# Patient Record
Sex: Female | Born: 1975 | Race: Black or African American | Hispanic: No
Health system: Southern US, Community
[De-identification: ages and names within clinical notes are randomized; demographics above are authoritative.]

## PROBLEM LIST (undated history)

## (undated) HISTORY — PX: ABDOMINAL HYSTERECTOMY: SHX81

## (undated) HISTORY — PX: BREAST LUMPECTOMY: SHX2

---

## 2017-05-08 ENCOUNTER — Encounter (HOSPITAL_COMMUNITY): Payer: Self-pay | Admitting: Emergency Medicine

## 2017-05-08 ENCOUNTER — Ambulatory Visit (HOSPITAL_COMMUNITY)
Admission: EM | Admit: 2017-05-08 | Discharge: 2017-05-08 | Disposition: A | Discharge status: Home / Self Care | Payer: Self-pay | Attending: Family Medicine | Admitting: Family Medicine

## 2017-05-08 ENCOUNTER — Ambulatory Visit (INDEPENDENT_AMBULATORY_CARE_PROVIDER_SITE_OTHER): Payer: Self-pay

## 2017-05-08 DIAGNOSIS — Y9367 Activity, basketball: Secondary | ICD-10-CM

## 2017-05-08 DIAGNOSIS — S62522A Displaced fracture of distal phalanx of left thumb, initial encounter for closed fracture: Secondary | ICD-10-CM

## 2017-05-08 MED ORDER — HYDROCODONE-ACETAMINOPHEN 5-325 MG PO TABS: 1.0000 | ORAL_TABLET | Freq: Four times a day (QID) | ORAL | 0 refills | Status: AC | PRN

## 2017-05-08 MED ORDER — MELOXICAM 15 MG PO TABS: 15.0000 mg | ORAL_TABLET | Freq: Every day | ORAL | 0 refills | Status: AC

## 2017-05-08 NOTE — ED Triage Notes (Signed)
PT injured left thumb playing basketball Monday night. She cannot bend it now and area is swollen.

## 2017-05-08 NOTE — ED Provider Notes (Signed)
[facility]    CSN: [id number] Arrival date & time: 05/08/17  1537     History   Chief Complaint Chief Complaint  Patient presents with  . Finger Injury    HPI Christina Atkins is a 42 y.o. female.   42 year old female comes in for 4-day history of left thumb pain after injury playing basketball.  States that she  hit the basketball, and that is when the pain started.  States area is swollen and cannot bend.  Been taking ibuprofen with some relief, but worsening symptoms today.  Denies numbness, tingling.      History reviewed. No pertinent past medical history.  There are no active problems to display for this patient.   Past Surgical History:  Procedure Laterality Date  . ABDOMINAL HYSTERECTOMY     partial  . BREAST LUMPECTOMY      OB History    No data available       Home Medications    Prior to Admission medications   Medication Sig Start Date End Date Taking? Authorizing Provider  simvastatin (ZOCOR) 10 MG tablet Take 10 mg by mouth daily.   Yes [provider]  HYDROcodone-acetaminophen (NORCO/VICODIN) 5-325 MG tablet Take 1 tablet by mouth every 6 (six) hours as needed. 05/08/17   Cathie HoopsYu, Akram Kissick V, PA-C  meloxicam (MOBIC) 15 MG tablet Take 1 tablet (15 mg total) by mouth daily for 15 days. 05/08/17 05/23/17  Belinda FisherYu, Famous Eisenhardt V, PA-C    Family History No family history on file.  Social History Social History   Tobacco Use  . Smoking status: Not on file  Substance Use Topics  . Alcohol use: Not on file  . Drug use: Not on file     Allergies   Penicillins and Reglan [metoclopramide]   Review of Systems Review of Systems  Reason unable to perform ROS: See HPI as above.     Physical Exam Triage Vital Signs ED Triage Vitals  Enc Vitals Group     BP 05/08/17 1630 (!) 109/93     Pulse Rate 05/08/17 1630 89     Resp 05/08/17 1630 16     Temp 05/08/17 1630 98.2 F (36.8 C)     Temp Source 05/08/17 1630 Oral     SpO2 05/08/17 1630 100 %       Weight 05/08/17 1629 205 lb (93 kg)     Height --      Head Circumference --      Peak Flow --      Pain Score 05/08/17 1628 8     Pain Loc --      Pain Edu? --      Excl. in GC? --    No data found.  Updated Vital Signs BP (!) 109/93   Pulse 89   Temp 98.2 F (36.8 C) (Oral)   Resp 16   Wt 205 lb (93 kg)   SpO2 100%   Visual Acuity Right Eye Distance:   Left Eye Distance:   Bilateral Distance:    Right Eye Near:   Left Eye Near:    Bilateral Near:     Physical Exam  Constitutional: She is oriented to person, place, and time. She appears well-developed and well-nourished. No distress.  HENT:  Head: Normocephalic and atraumatic.  Eyes: Conjunctivae are normal. Pupils are equal, round, and reactive to light.  Musculoskeletal:  Swelling of the thumb.  Small subungual hematoma of the base of the nail.  Acrylic nail currently  applied.  Tenderness to palpation of tip of the finger.  Decreased range of motion.  Sensation intact and equal bilaterally.  Cap refill unable to assess given long acrylic fingernail.  Neurological: She is alert and oriented to person, place, and time.     UC Treatments / Results  Labs (all labs ordered are listed, but only abnormal results are displayed) Labs Reviewed - No data to display  EKG  EKG Interpretation None       Radiology Dg Finger Thumb Left  Result Date: 05/08/2017 CLINICAL DATA:  Left thumb injury during basketball. EXAM: LEFT THUMB 2+V COMPARISON:  None. FINDINGS: There is a minimally displaced, oblique fracture through the base of the first distal phalanx. There is approximately 1 mm of volar displacement of the distal fragment. No definite intra-articular extension. Joint spaces are preserved. Bone mineralization is normal. Soft tissue swelling about the fracture. IMPRESSION: Minimally displaced, oblique fracture through the base of the first distal phalanx. Electronically Signed   By: Obie Dredge M.D.   On:  05/08/2017 16:47    Procedures Procedures (including critical care time)  Medications Ordered in UC Medications - No data to display   Initial Impression / Assessment and Plan / UC Course  I have reviewed the triage vital signs and the nursing notes.  Pertinent labs & imaging results that were available during my care of the patient were reviewed by me and considered in my medical decision making (see chart for details).    X-ray results discussed with patient.  Mobic for pain.  Norco for breakthrough pain.  Ice compress, elevation, finger splint.  Follow-up with orthopedics for further evaluation and management needed.  Patient expresses understanding and agrees to plan.  Final Clinical Impressions(s) / UC Diagnoses   Final diagnoses:  Closed displaced fracture of distal phalanx of left thumb, initial encounter    ED Discharge Orders        Ordered    meloxicam (MOBIC) 15 MG tablet  Daily     05/08/17 1708    HYDROcodone-acetaminophen (NORCO/VICODIN) 5-325 MG tablet  Every 6 hours PRN     05/08/17 1708       Controlled Substance Prescriptions [place] Controlled Substance Registry consulted? Yes, I have consulted the [place] Controlled Substances Registry for this patient, and feel the risk/benefit ratio today is favorable for proceeding with this prescription for a controlled substance.   Belinda Fisher, PA-C 05/08/17 2117

## 2017-05-08 NOTE — Discharge Instructions (Signed)
X-ray shows a fracture to the tip of her left thumb.  Start Mobic as directed for pain.  Take Norco for any breakthrough pain.  Ice compress, finger splint.  Follow-up with hand orthopedics for further evaluation and management needed.

## 2019-08-31 IMAGING — DX DG FINGER THUMB 2+V*L*
3 series · 3 of 3 positions shown · non-contrast
Comparison: None.

CLINICAL DATA: Left thumb injury during basketball.

EXAM:
LEFT THUMB 2+V

[finger ap]
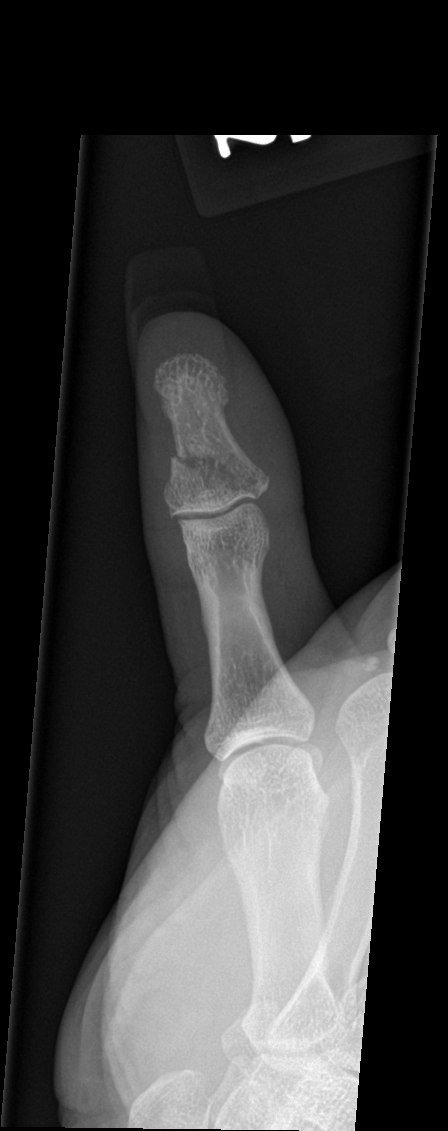

[finger obl]
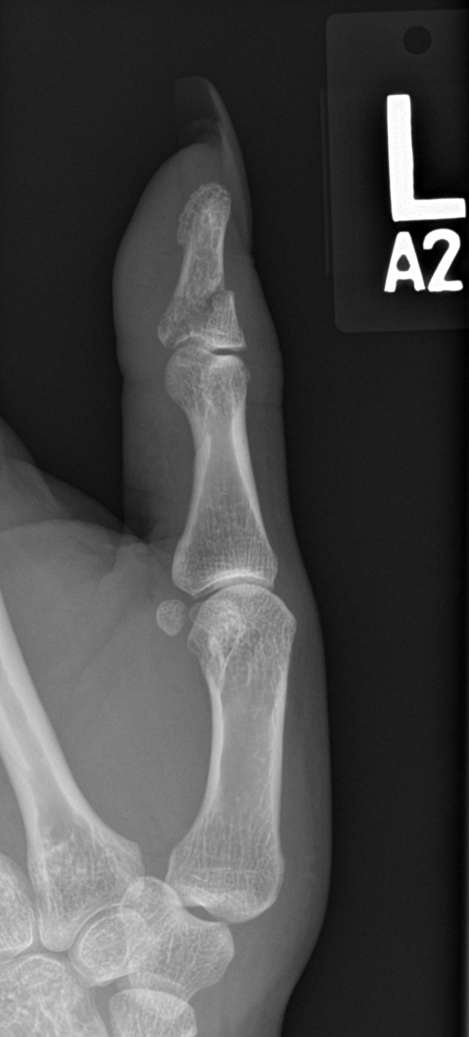

[finger lat]
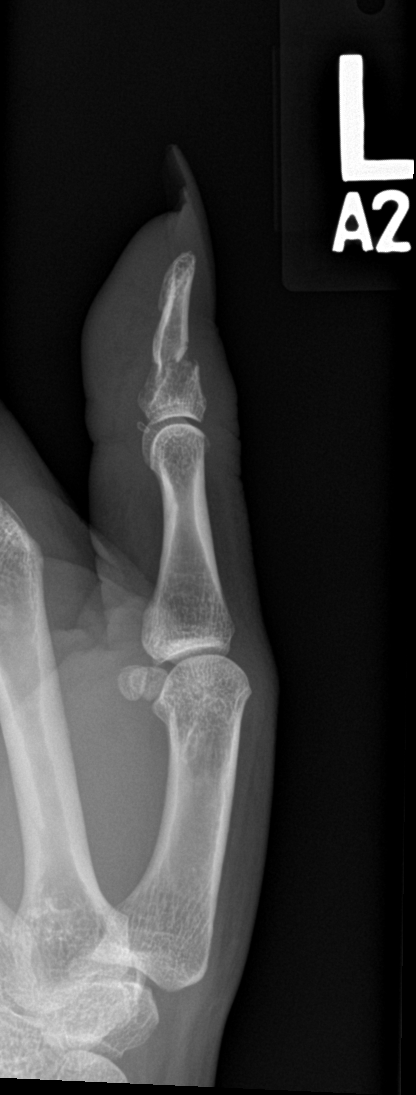

[3 of 3 positions shown; findings below may reference images not displayed]

FINDINGS: There is a minimally displaced, oblique fracture through the base of
the first distal phalanx. There is approximately 1 mm of volar
displacement of the distal fragment. No definite intra-articular
extension. Joint spaces are preserved. Bone mineralization is
normal. Soft tissue swelling about the fracture.
IMPRESSION: Minimally displaced, oblique fracture through the base of the first
distal phalanx.
